# Patient Record
Sex: Male | Born: 1990 | Race: Black or African American | Hispanic: No | Marital: Married | Smoking: Current some day smoker
Health system: Southern US, Community
[De-identification: ages and names within clinical notes are randomized; demographics above are authoritative.]

## PROBLEM LIST (undated history)

## (undated) DIAGNOSIS — Z789 Other specified health status: Secondary | ICD-10-CM

## (undated) HISTORY — PX: NO PAST SURGERIES: SHX2092

---

## 2013-09-14 ENCOUNTER — Emergency Department (HOSPITAL_COMMUNITY)
Admission: EM | Admit: 2013-09-14 | Discharge: 2013-09-14 | Disposition: A | Discharge status: Home / Self Care | Payer: BC Managed Care – PPO | Attending: Emergency Medicine | Admitting: Emergency Medicine

## 2013-09-14 ENCOUNTER — Encounter (HOSPITAL_COMMUNITY): Payer: Self-pay | Admitting: Emergency Medicine

## 2013-09-14 DIAGNOSIS — Y9289 Other specified places as the place of occurrence of the external cause: Secondary | ICD-10-CM | POA: Diagnosis not present

## 2013-09-14 DIAGNOSIS — X500XXA Overexertion from strenuous movement or load, initial encounter: Secondary | ICD-10-CM | POA: Insufficient documentation

## 2013-09-14 DIAGNOSIS — M25569 Pain in unspecified knee: Secondary | ICD-10-CM | POA: Diagnosis not present

## 2013-09-14 DIAGNOSIS — IMO0002 Reserved for concepts with insufficient information to code with codable children: Secondary | ICD-10-CM | POA: Insufficient documentation

## 2013-09-14 DIAGNOSIS — Y9389 Activity, other specified: Secondary | ICD-10-CM | POA: Diagnosis not present

## 2013-09-14 DIAGNOSIS — Z880 Allergy status to penicillin: Secondary | ICD-10-CM | POA: Diagnosis not present

## 2013-09-14 DIAGNOSIS — M25561 Pain in right knee: Secondary | ICD-10-CM

## 2013-09-14 DIAGNOSIS — S29012A Strain of muscle and tendon of back wall of thorax, initial encounter: Secondary | ICD-10-CM

## 2013-09-14 DIAGNOSIS — Y990 Civilian activity done for income or pay: Secondary | ICD-10-CM | POA: Diagnosis not present

## 2013-09-14 DIAGNOSIS — F17200 Nicotine dependence, unspecified, uncomplicated: Secondary | ICD-10-CM | POA: Insufficient documentation

## 2013-09-14 DIAGNOSIS — M25562 Pain in left knee: Secondary | ICD-10-CM

## 2013-09-14 MED ORDER — CYCLOBENZAPRINE HCL 10 MG PO TABS: 10.0000 mg | ORAL_TABLET | Freq: Every day | ORAL | Status: DC

## 2013-09-14 MED ORDER — IBUPROFEN 800 MG PO TABS: 800.0000 mg | ORAL_TABLET | Freq: Three times a day (TID) | ORAL | Status: DC

## 2013-09-14 NOTE — ED Provider Notes (Signed)
CSN: [id number]     Arrival date & time 09/14/13  1745 History  This chart was scribed for Marc DrownLauren Elmor Kost, PA-C working with Marc DrownLauren Marc Ironside, PA-C working with Marc CiscoMegan E Docherty, MD by Evon Slackerrance Branch, ED Scribe. This patient was seen in room TR06C/TR06C and the patient's care was started at 5:58 PM.    Chief Complaint  Patient presents with  . Back Pain   HPI Comments:  Marc Adkins is a 23 y.o. male who presents to the Emergency Department complaining of sharp right sided back pain onset yesterday. He states he was lifting something heavy at work he states he felt something pull in his upper right back. He states his pain severity is 8/10. He states he has difficulty sleeping due to the pain in his back. He states he took tylenol today with no relief to his symptoms. He denies any previous back injuries. He denies any other related symptoms. He also states that he has been having joint pains down in his knees for several months, no recent injury. He states he doesn't have a PCP.   Patient is a 23 y.o. male presenting with back pain. The history is provided by the patient. No language interpreter was used.  Back Pain Associated symptoms: no chest pain     History reviewed. No pertinent past medical history. History reviewed. No pertinent past surgical history. History reviewed. No pertinent family history. History  Substance Use Topics  . Smoking status: Current Some Day Smoker    Types: Cigarettes  . Smokeless tobacco: Never Used  . Alcohol Use: No    Review of Systems  Respiratory: Negative for cough and shortness of breath.   Cardiovascular: Negative for chest pain.  Musculoskeletal: Positive for arthralgias and myalgias. Negative for back pain.  Skin: Negative for color change and wound.  All other systems reviewed and are negative.   Allergies  Penicillins  Home Medications   Prior to Admission medications   Not on File   There were no vitals taken for this  visit. Physical Exam  Nursing note and vitals reviewed. Constitutional: He is oriented to person, place, and time. He appears well-developed and well-nourished.  Non-toxic appearance. He does not have a sickly appearance. He does not appear ill. No distress.  HENT:  Head: Normocephalic and atraumatic.  Eyes: Conjunctivae and EOM are normal.  Neck: Normal range of motion. Neck supple.  Pulmonary/Chest: Effort normal. No respiratory distress.  Musculoskeletal: Normal range of motion. He exhibits tenderness.       Right knee: He exhibits normal range of motion, no effusion and no deformity.       Left knee: He exhibits normal range of motion, no effusion and no deformity.       Thoracic back: He exhibits tenderness. He exhibits normal range of motion and no swelling.       Back:  No midline T-spine, or L-spine tenderness with no step-offs, crepitus, or deformities noted. Tenderness to right latissimus no obvious spasm, deformity. Knee: mild apophysitis of the tibial tubercle  Neurological: He is alert and oriented to person, place, and time.  Skin: Skin is warm and dry. He is not diaphoretic. No erythema.  Psychiatric: He has a normal mood and affect. His behavior is normal.    ED Course  Procedures (including critical care time)   Labs Review Labs Reviewed - No data to display  Imaging Review No results found.   EKG Interpretation None      MDM  Final diagnoses:  Strain of latissimus dorsi muscle  Knee pain, bilateral   Patient presents with right latissimus dorsi discomfort, worse with movement. No obvious spasm will treat with ibuprofen and Flexeril were sleeping. Ice encouraged. Patient also reports bilateral knee discomfort on going for a long time. No recent injury. 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  Ortho follow up given. Discussed treatment plan with the patient. Return precautions given. Reports understanding and no other concerns at this time.  Patient is  stable for discharge at this time. Meds given in ED:  Medications - No data to display  New Prescriptions   CYCLOBENZAPRINE (FLEXERIL) 10 MG TABLET    Take 1 tablet (10 mg total) by mouth at bedtime.   IBUPROFEN (ADVIL,MOTRIN) 800 MG TABLET    Take 1 tablet (800 mg total) by mouth 3 (three) times daily. Take with food    \ I personally performed the services described in this documentation, which was scribed in my presence. The recorded information has been reviewed and is accurate.      [name], PA-C 09/14/13 [phone]

## 2013-09-14 NOTE — ED Notes (Signed)
Pt reports he lifted something heavy yesterday at work and the rt side of his back hurts now. Pain is a 8/10 pain scale.

## 2013-09-14 NOTE — Discharge Instructions (Signed)
Call for a follow up appointment with a Family or Primary Care Provider.  Return if Symptoms worsen.   Take medication as prescribed.  Do not operate heavy machinery or make important decisions while taking flexeril. Ice your back and knees 3-4 times a day.

## 2013-09-14 NOTE — ED Notes (Signed)
Declined W/C at D/C and was escorted to lobby by RN. 

## 2013-09-16 NOTE — ED Provider Notes (Signed)
Medical screening examination/treatment/procedure(s) were performed by non-physician practitioner and as supervising physician I was immediately available for consultation/collaboration.   [name], MD 09/16/13 [phone]

## 2013-11-25 ENCOUNTER — Emergency Department (HOSPITAL_COMMUNITY): Payer: BC Managed Care – PPO

## 2013-11-25 ENCOUNTER — Encounter (HOSPITAL_COMMUNITY): Payer: Self-pay | Admitting: Emergency Medicine

## 2013-11-25 ENCOUNTER — Emergency Department (HOSPITAL_COMMUNITY)
Admission: EM | Admit: 2013-11-25 | Discharge: 2013-11-25 | Disposition: A | Discharge status: Home / Self Care | Payer: BC Managed Care – PPO | Attending: Emergency Medicine | Admitting: Emergency Medicine

## 2013-11-25 DIAGNOSIS — F17200 Nicotine dependence, unspecified, uncomplicated: Secondary | ICD-10-CM | POA: Diagnosis not present

## 2013-11-25 DIAGNOSIS — Y9239 Other specified sports and athletic area as the place of occurrence of the external cause: Secondary | ICD-10-CM | POA: Diagnosis not present

## 2013-11-25 DIAGNOSIS — Y92838 Other recreation area as the place of occurrence of the external cause: Secondary | ICD-10-CM

## 2013-11-25 DIAGNOSIS — S62308A Unspecified fracture of other metacarpal bone, initial encounter for closed fracture: Secondary | ICD-10-CM

## 2013-11-25 DIAGNOSIS — W2209XA Striking against other stationary object, initial encounter: Secondary | ICD-10-CM | POA: Diagnosis not present

## 2013-11-25 DIAGNOSIS — S0120XA Unspecified open wound of nose, initial encounter: Secondary | ICD-10-CM | POA: Diagnosis not present

## 2013-11-25 DIAGNOSIS — Y9361 Activity, american tackle football: Secondary | ICD-10-CM | POA: Diagnosis not present

## 2013-11-25 DIAGNOSIS — S62319A Displaced fracture of base of unspecified metacarpal bone, initial encounter for closed fracture: Secondary | ICD-10-CM | POA: Diagnosis not present

## 2013-11-25 DIAGNOSIS — W219XXA Striking against or struck by unspecified sports equipment, initial encounter: Secondary | ICD-10-CM | POA: Diagnosis not present

## 2013-11-25 DIAGNOSIS — S6990XA Unspecified injury of unspecified wrist, hand and finger(s), initial encounter: Secondary | ICD-10-CM | POA: Insufficient documentation

## 2013-11-25 DIAGNOSIS — Z880 Allergy status to penicillin: Secondary | ICD-10-CM | POA: Insufficient documentation

## 2013-11-25 MED ORDER — HYDROCODONE-ACETAMINOPHEN 5-325 MG PO TABS: 1.0000 | ORAL_TABLET | Freq: Four times a day (QID) | ORAL | Status: DC | PRN

## 2013-11-25 NOTE — ED Notes (Signed)
Pt states he was playing football 2 wks ago when he got elbowed in the face.  They lost the game so he hit a wall.  C/o nose pain and rt hand pain.

## 2013-11-25 NOTE — ED Notes (Signed)
Ortho still in room putting on splint.

## 2013-11-25 NOTE — Discharge Instructions (Signed)
Return here as needed.  Go to Dr. Carlos Levering office tomorrow morning at 7:30 AM for an appointment

## 2013-11-25 NOTE — ED Notes (Signed)
Ortho called for splint  

## 2013-11-25 NOTE — ED Provider Notes (Signed)
CSN: [id number]     Arrival date & time 11/25/13  1324 History   First MD Initiated Contact with Patient 11/25/13 1006     Chief Complaint  Patient presents with  . Facial Pain  . Hand Injury     (Consider location/radiation/quality/duration/timing/severity/associated sxs/prior Treatment) HPI Patient presents to the emergency department with right hand injury that occurred 2 weeks ago.  Patient, states, that he punched a wall after losing a football game.  The patient, states, that he has pain with palpation and movement.  Patient, states, that he thought the area would get better on its own but did not improved.  Patient denies numbness or weakness in the hand.  Patient, states, that he did get an elbow to the nose during this same game and has pain to that area that has improved over the last 2 weeks.         History reviewed. No pertinent past medical history. History reviewed. No pertinent past surgical history. History reviewed. No pertinent family history. History  Substance Use Topics  . Smoking status: Current Some Day Smoker    Types: Cigarettes  . Smokeless tobacco: Never Used  . Alcohol Use: No    Review of Systems  All other systems negative except as documented in the HPI. All pertinent positives and negatives as reviewed in the HPI.  Allergies  Penicillins  Home Medications   Prior to Admission medications   Medication Sig Start Date End Date Taking? Authorizing Provider  acetaminophen (TYLENOL) 500 MG tablet Take 500 mg by mouth every 4 (four) hours as needed for moderate pain or headache.   Yes Historical Provider, MD  Ibuprofen-Diphenhydramine Cit (ADVIL PM PO) Take 1-2 tablets by mouth once as needed (pain.).   Yes Historical Provider, MD   BP 121/58  Pulse 57  Temp(Src) 98 F (36.7 C) (Oral)  Resp 18  SpO2 100% Physical Exam  Nursing note and vitals reviewed. Constitutional: He appears well-developed and well-nourished.  HENT:  Head:  Normocephalic.  Nose:    Pulmonary/Chest: Effort normal.  Musculoskeletal:       Right hand: He exhibits decreased range of motion, tenderness, bony tenderness, deformity and swelling. He exhibits normal two-point discrimination, normal capillary refill and no laceration. Normal sensation noted. Normal strength noted.    ED Course  Procedures (including critical care time) Labs Review Labs Reviewed - No data to display  Imaging Review Dg Hand Complete Right  11/25/2013   CLINICAL DATA:  Pain post trauma  EXAM: RIGHT HAND - COMPLETE 3+ VIEW  COMPARISON:  None.  FINDINGS: Frontal, oblique, and lateral views were obtained. There is a comminuted fracture through the midportion of the fourth metacarpal with volar angulation distally. There is also slight medial displacement distally. No other fractures. No dislocation. Joint spaces appear intact.  IMPRESSION: Comminuted fracture mid portion fourth metacarpal with angulation and displacement.   Electronically Signed   By: Bretta Bang M.D.   On: 11/25/2013 10:08      I spoke with Dr. Amanda Pea of hand surgery, who will see the patient in his office tomorrow morning.  At 7:30 AM he did review the x-rays.  Patient is given a plan and all questions were answered.  The patient was an understanding and I made it clear to him, that he'll need surgery to fix this area for Full recovery  Carlyle Dolly, PA-C 11/25/13 1147

## 2013-11-28 NOTE — ED Provider Notes (Signed)
Medical screening examination/treatment/procedure(s) were performed by non-physician practitioner and as supervising physician I was immediately available for consultation/collaboration.   Toy Baker, MD 11/28/13 [phone]

## 2013-12-01 ENCOUNTER — Encounter (HOSPITAL_COMMUNITY): Payer: Self-pay | Admitting: *Deleted

## 2013-12-01 ENCOUNTER — Encounter (HOSPITAL_COMMUNITY): Payer: Self-pay | Admitting: Pharmacy Technician

## 2013-12-02 ENCOUNTER — Ambulatory Visit (HOSPITAL_COMMUNITY)
Admission: RE | Admit: 2013-12-02 | Discharge: 2013-12-02 | Disposition: A | Discharge status: Home / Self Care | Payer: BC Managed Care – PPO | Source: Ambulatory Visit | Attending: Orthopedic Surgery | Admitting: Orthopedic Surgery

## 2013-12-02 ENCOUNTER — Encounter (HOSPITAL_COMMUNITY): Payer: Self-pay | Admitting: *Deleted

## 2013-12-02 ENCOUNTER — Ambulatory Visit (HOSPITAL_COMMUNITY): Payer: BC Managed Care – PPO | Admitting: Certified Registered Nurse Anesthetist

## 2013-12-02 ENCOUNTER — Encounter (HOSPITAL_COMMUNITY): Payer: BC Managed Care – PPO | Admitting: Certified Registered Nurse Anesthetist

## 2013-12-02 ENCOUNTER — Encounter (HOSPITAL_COMMUNITY)
Admission: RE | Disposition: A | Discharge status: Home / Self Care | Payer: Self-pay | Source: Ambulatory Visit | Attending: Orthopedic Surgery

## 2013-12-02 DIAGNOSIS — F17200 Nicotine dependence, unspecified, uncomplicated: Secondary | ICD-10-CM | POA: Insufficient documentation

## 2013-12-02 DIAGNOSIS — S62309A Unspecified fracture of unspecified metacarpal bone, initial encounter for closed fracture: Secondary | ICD-10-CM | POA: Diagnosis present

## 2013-12-02 DIAGNOSIS — X58XXXA Exposure to other specified factors, initial encounter: Secondary | ICD-10-CM | POA: Diagnosis not present

## 2013-12-02 DIAGNOSIS — Z880 Allergy status to penicillin: Secondary | ICD-10-CM | POA: Insufficient documentation

## 2013-12-02 HISTORY — PX: OPEN REDUCTION INTERNAL FIXATION (ORIF) METACARPAL: SHX6234

## 2013-12-02 HISTORY — DX: Other specified health status: Z78.9

## 2013-12-02 LAB — CBC
HEMATOCRIT: 37.7 % — AB (ref 39.0–52.0)
Hemoglobin: 12.6 g/dL — ABNORMAL LOW (ref 13.0–17.0)
MCH: 31 pg (ref 26.0–34.0)
MCHC: 33.4 g/dL (ref 30.0–36.0)
MCV: 92.6 fL (ref 78.0–100.0)
Platelets: 212 10*3/uL (ref 150–400)
RBC: 4.07 MIL/uL — ABNORMAL LOW (ref 4.22–5.81)
RDW: 11.9 % (ref 11.5–15.5)
WBC: 4.7 10*3/uL (ref 4.0–10.5)

## 2013-12-02 SURGERY — OPEN REDUCTION INTERNAL FIXATION (ORIF) METACARPAL
Anesthesia: General | Laterality: Right

## 2013-12-02 MED ORDER — BUPIVACAINE HCL (PF) 0.25 % IJ SOLN
INTRAMUSCULAR | Status: DC | PRN
  Administered 2013-12-02: 10 mL

## 2013-12-02 MED ORDER — MIDAZOLAM HCL 2 MG/2ML IJ SOLN
INTRAMUSCULAR | Status: AC
  Filled 2013-12-02: qty 2

## 2013-12-02 MED ORDER — ONDANSETRON HCL 4 MG/2ML IJ SOLN
INTRAMUSCULAR | Status: DC | PRN
  Administered 2013-12-02: 4 mg via INTRAVENOUS

## 2013-12-02 MED ORDER — ACETAMINOPHEN 160 MG/5ML PO SOLN: 325.0000 mg | ORAL | Status: DC | PRN

## 2013-12-02 MED ORDER — SULFAMETHOXAZOLE-TMP DS 800-160 MG PO TABS: 1.0000 | ORAL_TABLET | Freq: Two times a day (BID) | ORAL | Status: AC

## 2013-12-02 MED ORDER — BUPIVACAINE HCL (PF) 0.25 % IJ SOLN
INTRAMUSCULAR | Status: AC
  Filled 2013-12-02: qty 30

## 2013-12-02 MED ORDER — FENTANYL CITRATE 0.05 MG/ML IJ SOLN
INTRAMUSCULAR | Status: AC
  Filled 2013-12-02: qty 2

## 2013-12-02 MED ORDER — 0.9 % SODIUM CHLORIDE (POUR BTL) OPTIME
TOPICAL | Status: DC | PRN
  Administered 2013-12-02: 1000 mL

## 2013-12-02 MED ORDER — OXYCODONE HCL 5 MG PO TABS
ORAL_TABLET | ORAL | Status: AC
  Filled 2013-12-02: qty 1

## 2013-12-02 MED ORDER — OXYCODONE HCL 5 MG/5ML PO SOLN: 5.0000 mg | Freq: Once | ORAL | Status: AC | PRN

## 2013-12-02 MED ORDER — PROPOFOL 10 MG/ML IV BOLUS
INTRAVENOUS | Status: AC
  Filled 2013-12-02: qty 20

## 2013-12-02 MED ORDER — VANCOMYCIN HCL IN DEXTROSE 1-5 GM/200ML-% IV SOLN
INTRAVENOUS | Status: AC
  Administered 2013-12-02: 1000 mg via INTRAVENOUS
  Filled 2013-12-02: qty 200

## 2013-12-02 MED ORDER — FENTANYL CITRATE 0.05 MG/ML IJ SOLN
INTRAMUSCULAR | Status: DC | PRN
  Administered 2013-12-02: 50 ug via INTRAVENOUS
  Administered 2013-12-02 (×2): 25 ug via INTRAVENOUS

## 2013-12-02 MED ORDER — OXYCODONE HCL 5 MG PO TABS
5.0000 mg | ORAL_TABLET | Freq: Once | ORAL | Status: AC | PRN
  Administered 2013-12-02: 5 mg via ORAL

## 2013-12-02 MED ORDER — LIDOCAINE HCL (CARDIAC) 20 MG/ML IV SOLN
INTRAVENOUS | Status: AC
  Filled 2013-12-02: qty 5

## 2013-12-02 MED ORDER — PROPOFOL 10 MG/ML IV BOLUS
INTRAVENOUS | Status: DC | PRN
  Administered 2013-12-02: 180 mg via INTRAVENOUS

## 2013-12-02 MED ORDER — MIDAZOLAM HCL 5 MG/5ML IJ SOLN
INTRAMUSCULAR | Status: DC | PRN
  Administered 2013-12-02: 2 mg via INTRAVENOUS

## 2013-12-02 MED ORDER — ACETAMINOPHEN 325 MG PO TABS: 325.0000 mg | ORAL_TABLET | ORAL | Status: DC | PRN

## 2013-12-02 MED ORDER — LACTATED RINGERS IV SOLN
INTRAVENOUS | Status: DC
  Administered 2013-12-02: 14:00:00 via INTRAVENOUS

## 2013-12-02 MED ORDER — LIDOCAINE HCL (CARDIAC) 20 MG/ML IV SOLN
INTRAVENOUS | Status: DC | PRN
  Administered 2013-12-02: 80 mg via INTRAVENOUS

## 2013-12-02 MED ORDER — ONDANSETRON HCL 4 MG/2ML IJ SOLN
INTRAMUSCULAR | Status: AC
  Filled 2013-12-02: qty 2

## 2013-12-02 MED ORDER — FENTANYL CITRATE 0.05 MG/ML IJ SOLN
INTRAMUSCULAR | Status: AC
  Filled 2013-12-02: qty 5

## 2013-12-02 MED ORDER — HYDROMORPHONE HCL PF 1 MG/ML IJ SOLN: 0.2500 mg | INTRAMUSCULAR | Status: DC | PRN

## 2013-12-02 MED ORDER — KETOROLAC TROMETHAMINE 30 MG/ML IJ SOLN: 15.0000 mg | Freq: Once | INTRAMUSCULAR | Status: DC | PRN

## 2013-12-02 MED ORDER — LACTATED RINGERS IV SOLN: INTRAVENOUS | Status: DC | PRN

## 2013-12-02 MED ORDER — OXYCODONE HCL 5 MG PO TABS: 5.0000 mg | ORAL_TABLET | ORAL | Status: DC | PRN

## 2013-12-02 MED ORDER — LACTATED RINGERS IV SOLN
INTRAVENOUS | Status: DC | PRN
  Administered 2013-12-02 (×2): via INTRAVENOUS

## 2013-12-02 SURGICAL SUPPLY — 64 items
BANDAGE ELASTIC 3 VELCRO ST LF (GAUZE/BANDAGES/DRESSINGS) ×3 IMPLANT
BANDAGE ELASTIC 4 VELCRO ST LF (GAUZE/BANDAGES/DRESSINGS) IMPLANT
BNDG COHESIVE 1X5 TAN STRL LF (GAUZE/BANDAGES/DRESSINGS) IMPLANT
BNDG CONFORM 2 STRL LF (GAUZE/BANDAGES/DRESSINGS) ×3 IMPLANT
BNDG ELASTIC 2 VLCR STRL LF (GAUZE/BANDAGES/DRESSINGS) ×3 IMPLANT
BNDG GAUZE ELAST 4 BULKY (GAUZE/BANDAGES/DRESSINGS) ×6 IMPLANT
CAP PIN ORTHO PINK (CAP) IMPLANT
CAP PIN PROTECTOR ORTHO WHT (CAP) IMPLANT
CORDS BIPOLAR (ELECTRODE) ×3 IMPLANT
COVER SURGICAL LIGHT HANDLE (MISCELLANEOUS) ×3 IMPLANT
CUFF TOURNIQUET SINGLE 18IN (TOURNIQUET CUFF) ×3 IMPLANT
CUFF TOURNIQUET SINGLE 24IN (TOURNIQUET CUFF) IMPLANT
DRAPE OEC MINIVIEW 54X84 (DRAPES) IMPLANT
DRAPE SURG 17X23 STRL (DRAPES) ×3 IMPLANT
DRSG EMULSION OIL 3X3 NADH (GAUZE/BANDAGES/DRESSINGS) ×3 IMPLANT
GAUZE SPONGE 2X2 8PLY STRL LF (GAUZE/BANDAGES/DRESSINGS) IMPLANT
GAUZE SPONGE 4X4 12PLY STRL (GAUZE/BANDAGES/DRESSINGS) IMPLANT
GAUZE XEROFORM 1X8 LF (GAUZE/BANDAGES/DRESSINGS) ×3 IMPLANT
GLOVE BIOGEL M STRL SZ7.5 (GLOVE) ×3 IMPLANT
GLOVE BIOGEL PI IND STRL 7.0 (GLOVE) ×1 IMPLANT
GLOVE BIOGEL PI INDICATOR 7.0 (GLOVE) ×2
GLOVE SS BIOGEL STRL SZ 8 (GLOVE) ×1 IMPLANT
GLOVE SUPERSENSE BIOGEL SZ 8 (GLOVE) ×2
GLOVE SURG SS PI 7.0 STRL IVOR (GLOVE) ×3 IMPLANT
GOWN STRL REUS W/ TWL LRG LVL3 (GOWN DISPOSABLE) ×2 IMPLANT
GOWN STRL REUS W/ TWL XL LVL3 (GOWN DISPOSABLE) ×3 IMPLANT
GOWN STRL REUS W/TWL LRG LVL3 (GOWN DISPOSABLE) ×4
GOWN STRL REUS W/TWL XL LVL3 (GOWN DISPOSABLE) ×6
K-WIRE SMTH SNGL TROCAR .028X4 (WIRE)
K-WIRE SMTH SNGL TROCAR .035X9 ×3 IMPLANT
KIT BASIN OR (CUSTOM PROCEDURE TRAY) ×3 IMPLANT
KIT ROOM TURNOVER OR (KITS) ×3 IMPLANT
KWIRE SMTH SNGL TROCAR .028X4 (WIRE) IMPLANT
KWIRE SMTH SNGL TROCAR .035X9 ×1 IMPLANT
MANIFOLD NEPTUNE II (INSTRUMENTS) ×3 IMPLANT
NEEDLE HYPO 25GX1X1/2 BEV (NEEDLE) IMPLANT
NS IRRIG 1000ML POUR BTL (IV SOLUTION) ×3 IMPLANT
PACK ORTHO EXTREMITY (CUSTOM PROCEDURE TRAY) ×3 IMPLANT
PAD ARMBOARD 7.5X6 YLW CONV (MISCELLANEOUS) ×6 IMPLANT
PAD CAST 4YDX4 CTTN HI CHSV (CAST SUPPLIES) IMPLANT
PADDING CAST COTTON 4X4 STRL (CAST SUPPLIES)
PADDING CAST SYNTHETIC 4 (CAST SUPPLIES) ×2
PADDING CAST SYNTHETIC 4X4 STR (CAST SUPPLIES) ×1 IMPLANT
PUTTY DBM STAGRAFT PLUS 2CC (Putty) ×3 IMPLANT
SOLUTION BETADINE 4OZ (MISCELLANEOUS) ×3 IMPLANT
SPLINT FIBERGLASS 4X30 (CAST SUPPLIES) ×3 IMPLANT
SPONGE GAUZE 2X2 STER 10/PKG (GAUZE/BANDAGES/DRESSINGS)
SPONGE GAUZE 4X4 12PLY STER LF (GAUZE/BANDAGES/DRESSINGS) ×3 IMPLANT
SPONGE SCRUB IODOPHOR (GAUZE/BANDAGES/DRESSINGS) IMPLANT
SUCTION FRAZIER TIP 10 FR DISP (SUCTIONS) ×3 IMPLANT
SUT MERSILENE 4 0 P 3 (SUTURE) IMPLANT
SUT PROLENE 4 0 PS 2 18 (SUTURE) IMPLANT
SUT PROLENE 5 0 PC 1 (SUTURE) ×3 IMPLANT
SUT VIC AB 2-0 CT1 27 (SUTURE)
SUT VIC AB 2-0 CT1 TAPERPNT 27 (SUTURE) IMPLANT
SUT VIC AB 4-0 P-3 18X BRD (SUTURE) ×1 IMPLANT
SUT VIC AB 4-0 P3 18 (SUTURE) ×2
SYR CONTROL 10ML LL (SYRINGE) IMPLANT
TOWEL OR 17X24 6PK STRL BLUE (TOWEL DISPOSABLE) ×3 IMPLANT
TOWEL OR 17X26 10 PK STRL BLUE (TOWEL DISPOSABLE) ×3 IMPLANT
TUBE CONNECTING 12'X1/4 (SUCTIONS) ×1
TUBE CONNECTING 12X1/4 (SUCTIONS) ×2 IMPLANT
UNDERPAD 30X30 INCONTINENT (UNDERPADS AND DIAPERS) ×3 IMPLANT
WATER STERILE IRR 1000ML POUR (IV SOLUTION) ×3 IMPLANT

## 2013-12-02 NOTE — Op Note (Signed)
See dictation#[id number] GramigMd

## 2013-12-02 NOTE — Anesthesia Preprocedure Evaluation (Addendum)
Anesthesia Evaluation  Patient identified by MRN, date of birth, ID band Patient awake    Reviewed: Allergy & Precautions, H&P , NPO status , Patient's Chart, lab work & pertinent test results  History of Anesthesia Complications Negative for: history of anesthetic complications  Airway Mallampati: I TM Distance: >3 FB Neck ROM: Full    Dental  (+) Teeth Intact, Dental Advisory Given   Pulmonary neg sleep apnea, neg COPDCurrent Smoker,  breath sounds clear to auscultation        Cardiovascular negative cardio ROS  Rhythm:Regular     Neuro/Psych negative neurological ROS  negative psych ROS   GI/Hepatic negative GI ROS, Neg liver ROS,   Endo/Other  negative endocrine ROS  Renal/GU negative Renal ROS     Musculoskeletal   Abdominal   Peds  Hematology   Anesthesia Other Findings   Reproductive/Obstetrics                        Anesthesia Physical Anesthesia Plan  ASA: I  Anesthesia Plan:    Post-op Pain Management:    Induction: Intravenous  Airway Management Planned: LMA  Additional Equipment:   Intra-op Plan:   Post-operative Plan: Extubation in OR  Informed Consent:   Dental advisory given  Plan Discussed with: CRNA and Surgeon  Anesthesia Plan Comments:       Anesthesia Quick Evaluation

## 2013-12-02 NOTE — H&P (Signed)
Marc Adkins is an 23 y.o. male.   Chief Complaint: Closed fourth metacarpal fracture displaced right upper extremity HPI: Patient presents for open reduction internal fixation right fourth metacarpal fracture  Patient denies other complaints.  This is a late presentation and is highly angulated.  He denies other complaints.  Patient presents for evaluation and treatment of the of their upper extremity predicament. The patient denies neck back chest or of abdominal pain. The patient notes that they have no lower extremity problems. The patient from primarily complains of the upper extremity pain noted.    Past Medical History  Diagnosis Date  . Medical history non-contributory     Past Surgical History  Procedure Laterality Date  . No past surgeries      History reviewed. No pertinent family history. Social History:  reports that he has been smoking Cigarettes.  He has a 2.64 pack-year smoking history. He has never used smokeless tobacco. He reports that he does not drink alcohol or use illicit drugs.  Allergies:  Allergies  Allergen Reactions  . Penicillins Anaphylaxis    Medications Prior to Admission  Medication Sig Dispense Refill  . HYDROcodone-acetaminophen (NORCO/VICODIN) 5-325 MG per tablet Take 1 tablet by mouth every 6 (six) hours as needed for moderate pain.  15 tablet  0  . Ibuprofen-Diphenhydramine Cit (ADVIL PM PO) Take 1-2 tablets by mouth once as needed (pain.).      Marland Kitchen oxyCODONE-acetaminophen (PERCOCET/ROXICET) 5-325 MG per tablet Take 2 tablets by mouth every 6 (six) hours as needed for severe pain.      Marland Kitchen ibuprofen (ADVIL,MOTRIN) 600 MG tablet Take 600 mg by mouth every 6 (six) hours as needed.        Results for orders placed during the hospital encounter of 12/02/13 (from the past 48 hour(s))  CBC     Status: Abnormal   Collection Time    12/02/13  1:54 PM      Result Value Ref Range   WBC 4.7  4.0 - 10.5 K/uL   RBC 4.07 (*) 4.22 - 5.81 MIL/uL   Hemoglobin 12.6 (*) 13.0 - 17.0 g/dL   HCT 40.9 (*) 81.1 - 91.4 %   MCV 92.6  78.0 - 100.0 fL   MCH 31.0  26.0 - 34.0 pg   MCHC 33.4  30.0 - 36.0 g/dL   RDW 78.2  95.6 - 21.3 %   Platelets 212  150 - 400 K/uL   No results found.  ROS  Blood pressure 118/59, pulse 58, temperature 97 F (36.1 C), temperature source Oral, resp. rate 20, height  (1.727 m), weight 71.986 kg (158 lb 11.2 oz), SpO2 100.00%. Physical Exam  Closed right fourth metacarpal fracture displaced we'll plan for reconstruction  he is neurovascularly intact  he has no signs of infection or compartment syndrome The patient is alert and oriented in no acute distress the patient complains of pain in the affected upper extremity.  The patient is noted to have a normal HEENT exam.  Lung fields show equal chest expansion and no shortness of breath  abdomen exam is nontender without distention.  Lower extremity examination does not show any fracture dislocation or blood clot symptoms.  Pelvis is stable neck and back are stable and nontender Assessment/Plan We'll plan for open reduction internal fixation right fourth metacarpal fracture as necessary.  In her stance risk and benefits as well as timeframe duration of recovery  We are planning surgery for your upper extremity. The risk and benefits  of surgery include risk of bleeding infection anesthesia damage to normal structures and failure of the surgery to accomplish its intended goals of relieving symptoms and restoring function with this in mind we'll going to proceed. I have specifically discussed with the patient the pre-and postoperative regime and the does and don'ts and risk and benefits in great detail. Risk and benefits of surgery also include risk of dystrophy chronic nerve pain failure of the healing process to go onto completion and other inherent risks of surgery The relavent the pathophysiology of the disease/injury process, as well as the alternatives for  treatment and postoperative course of action has been discussed in great detail with the patient who desires to proceed.  We will do everything in our power to help you (the patient) restore function to the upper extremity. Is a pleasure to see this patient today.   Karen Chafe 12/02/2013, 5:27 PM

## 2013-12-02 NOTE — Transfer of Care (Signed)
Immediate Anesthesia Transfer of Care Note  Patient: Marc Adkins  Procedure(s) Performed: Procedure(s): OPEN REDUCTION INTERNAL FIXATION (ORIF) RIGHT RING FINGER,FOURTH METACARPAL FRACTRURE (Right)  Patient Location: PACU  Anesthesia Type:General  Level of Consciousness: awake and alert   Airway & Oxygen Therapy: Patient Spontanous Breathing and Patient connected to nasal cannula oxygen  Post-op Assessment: Report given to PACU RN and Post -op Vital signs reviewed and stable  Post vital signs: Reviewed and stable  Complications: No apparent anesthesia complications

## 2013-12-02 NOTE — Anesthesia Procedure Notes (Signed)
Procedure Name: LMA Insertion Date/Time: 12/02/2013 6:01 PM Performed by: Sarita Haver T Pre-anesthesia Checklist: Patient identified, Emergency Drugs available, Suction available, Patient being monitored and Timeout performed Patient Re-evaluated:Patient Re-evaluated prior to inductionOxygen Delivery Method: Circle system utilized and Simple face mask Preoxygenation: Pre-oxygenation with 100% oxygen Intubation Type: IV induction Ventilation: Mask ventilation without difficulty LMA: LMA inserted LMA Size: 4.0 Number of attempts: 1 Airway Equipment and Method: Patient positioned with wedge pillow Placement Confirmation: positive ETCO2 and breath sounds checked- equal and bilateral Tube secured with: Tape Dental Injury: Teeth and Oropharynx as per pre-operative assessment

## 2013-12-02 NOTE — Discharge Instructions (Signed)

## 2013-12-03 NOTE — Anesthesia Postprocedure Evaluation (Signed)
Anesthesia Post Note  Patient: Marc Adkins  Procedure(s) Performed: Procedure(s) (LRB): OPEN REDUCTION INTERNAL FIXATION (ORIF) RIGHT RING FINGER,FOURTH METACARPAL FRACTRURE (Right)  Anesthesia type: general  Patient location: PACU  Post pain: Pain level controlled  Post assessment: Patient's Cardiovascular Status Stable  Last Vitals:  Filed Vitals:   12/02/13 2000  BP: 141/85  Pulse: 55  Temp: 36.7 C  Resp: 15    Post vital signs: Reviewed and stable  Level of consciousness: sedated  Complications: No apparent anesthesia complications

## 2013-12-03 NOTE — Op Note (Signed)
Marc Adkins, Marc Adkins             ACCOUNT NO.:  [id number]  MEDICAL RECORD NO.:  [id number]  LOCATION:  [facility]                         FACILITY:  [facility]  PHYSICIAN:  Dionne Ano. Atara Paterson, M.D.DATE OF BIRTH:  01-11-1991  DATE OF PROCEDURE: DATE OF DISCHARGE:  12/02/2013                              OPERATIVE REPORT   PREOPERATIVE DIAGNOSIS:  Displaced comminuted late presentation of a ring finger metacarpal fracture.  POSTOPERATIVE DIAGNOSIS:  Displaced comminuted late presentation of a ring finger metacarpal fracture.  PROCEDURE: 1. Open reduction and internal fixation, ring finger metacarpal     fracture with allograft bone graft and intramedullary wire     fixation. 2. AP and lateral oblique x-rays performed, examined, and interpreted     by myself. 3. Dorsal sensory branch ulnar nerve neurolysis, which was scarred to     the fracture region.  SURGEON:  Dionne Ano. Amanda Pea, M.D.  ASSISTANT:  Karie Chimera, P.A-C.  COMPLICATIONS:  None.  ANESTHESIA:  General.  INDICATIONS:  The patient is a pleasant male with a late presentation of ring finger metacarpal fracture.  I have recommended moving forward with ORIF.  He desires to proceed.  DESCRIPTION OF PROCEDURE:  The patient was seen by myself and Anesthesia, taken to operative suite, and underwent smooth induction of general anesthetic, laid supine, fully padded, prepped and draped in usual sterile fashion.  Betadine scrub and paint.  Once this was done, the patient then underwent a very careful and cautious draping followed by final time-out being called.  Pre and postop check was complete, and the patient then underwent a very careful and cautious elevation of tourniquet followed by a curvilinear incision over the fracture site. The fracture was exposed. I should note, the dissection was through skin with knife blade. Following this, blunt and sharp dissection was carried out with scissor tip, and a blunt instrument to  dissect down to the patient's fracture. Dorsal sensory branch to the ulnar nerve underwent distinct and separate neurolysis as it was adhered to the soft tissue to some degree.  Once this done, I then split the interval between the extensor apparatus. Identified the fracture site and then very carefully and cautiously dissected down.  The fracture unfortunately was horribly comminuted. There were bone chips in many areas.  I looked at the fracture very doubtfully and felt that an IM rod technique would be helpful for micromotion and to stimulate fracture callus.  This would also minimize exposed hardware.  At this time, we irrigated copiously and then made a counter incision over the 4th [facility] base.  Dissection was carried down.  Pilot hole was made and following this, a blunt-tipped prebent 0.062 K-wire was placed. An intramedullary technique to afford fixation of the fracture in reduced position.  I bone grafted this with allograft bone graft from the [employer] manufacture and also place bone bits from the comminuted fragments that we had in the vicinity.  The patient tolerated this well. Following this, I very carefully irrigated followed by closure of the periosteum with stitching and wanted to make sure we had a good periosteal closure.  I stress tested it nicely.  His splay looked excellent.  Wounds were closed with Prolene.  The patient was then taken to the recovery room after sterile dressing was applied.  We performed very meticulous sterile dressing without complicating features.  There were no complicating features.  I have discussed with he and his family all issues.  He will be monitored.  We will discharge him on Keflex as well as pain medicine.  We will see him back in 12-14 days cast at the time.  We are going to cast him for 4-6 weeks.  This was a highly unstable fracture at 6 weeks.  Consider moving forward with something removable pending x-ray evaluation.  It has  been a pleasure to see him today and participate in his postop recovery.  He tolerated the procedure quite well.  This was a very comminuted fracture.  Unfortunately, I do feel the late presentation made things a bit more challenging, however, I was very happy with the fixation and the overall alignment.  At the conclusion of the case, he had good rotation, good splay, and no complications.     Dionne Ano. Amanda Pea, M.D.     [facility]  D:  12/02/2013  T:  12/03/2013  Job:  [id number]

## 2013-12-04 ENCOUNTER — Encounter (HOSPITAL_COMMUNITY): Payer: Self-pay | Admitting: Orthopedic Surgery

## 2013-12-20 ENCOUNTER — Emergency Department (HOSPITAL_COMMUNITY)
Admission: EM | Admit: 2013-12-20 | Discharge: 2013-12-20 | Disposition: A | Discharge status: Home / Self Care | Payer: BC Managed Care – PPO | Attending: Emergency Medicine | Admitting: Emergency Medicine

## 2013-12-20 ENCOUNTER — Emergency Department (HOSPITAL_COMMUNITY): Payer: BC Managed Care – PPO

## 2013-12-20 ENCOUNTER — Encounter (HOSPITAL_COMMUNITY): Payer: Self-pay | Admitting: Emergency Medicine

## 2013-12-20 DIAGNOSIS — M79641 Pain in right hand: Secondary | ICD-10-CM

## 2013-12-20 DIAGNOSIS — Z23 Encounter for immunization: Secondary | ICD-10-CM | POA: Insufficient documentation

## 2013-12-20 DIAGNOSIS — Z880 Allergy status to penicillin: Secondary | ICD-10-CM | POA: Diagnosis not present

## 2013-12-20 DIAGNOSIS — M79609 Pain in unspecified limb: Secondary | ICD-10-CM | POA: Diagnosis not present

## 2013-12-20 DIAGNOSIS — G8911 Acute pain due to trauma: Secondary | ICD-10-CM | POA: Insufficient documentation

## 2013-12-20 DIAGNOSIS — F17200 Nicotine dependence, unspecified, uncomplicated: Secondary | ICD-10-CM | POA: Diagnosis not present

## 2013-12-20 MED ORDER — OXYCODONE HCL 5 MG PO TABS: 5.0000 mg | ORAL_TABLET | ORAL | Status: AC | PRN

## 2013-12-20 MED ORDER — TETANUS-DIPHTH-ACELL PERTUSSIS 5-2.5-18.5 LF-MCG/0.5 IM SUSP
0.5000 mL | Freq: Once | INTRAMUSCULAR | Status: AC
  Administered 2013-12-20: 0.5 mL via INTRAMUSCULAR
  Filled 2013-12-20: qty 0.5
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
## 2013-12-20 NOTE — Discharge Instructions (Signed)
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
## 2013-12-21 NOTE — ED Provider Notes (Signed)
I saw and evaluated the patient, reviewed the resident's note and I agree with the findings and plan.   .Face to face Exam:  General:  Awake HEENT:  Atraumatic Resp:  Normal effort Abd:  Nondistended Neuro:No focal weakness  Stacey Sago L Taytum Scheck, MD 12/21/13 0741 

## 2016-06-19 IMAGING — CR DG HAND COMPLETE 3+V*R*
3 series · 3 of 3 positions shown · non-contrast
Comparison: None.

CLINICAL DATA: Right hand injury with subsequent surgery on
December 02, 2013. Possible Re injury with pain in right ring finger
extending into the palm. The films are obtained in plaster.

EXAM:
RIGHT HAND - COMPLETE 3+ VIEW

[x hand pa right]
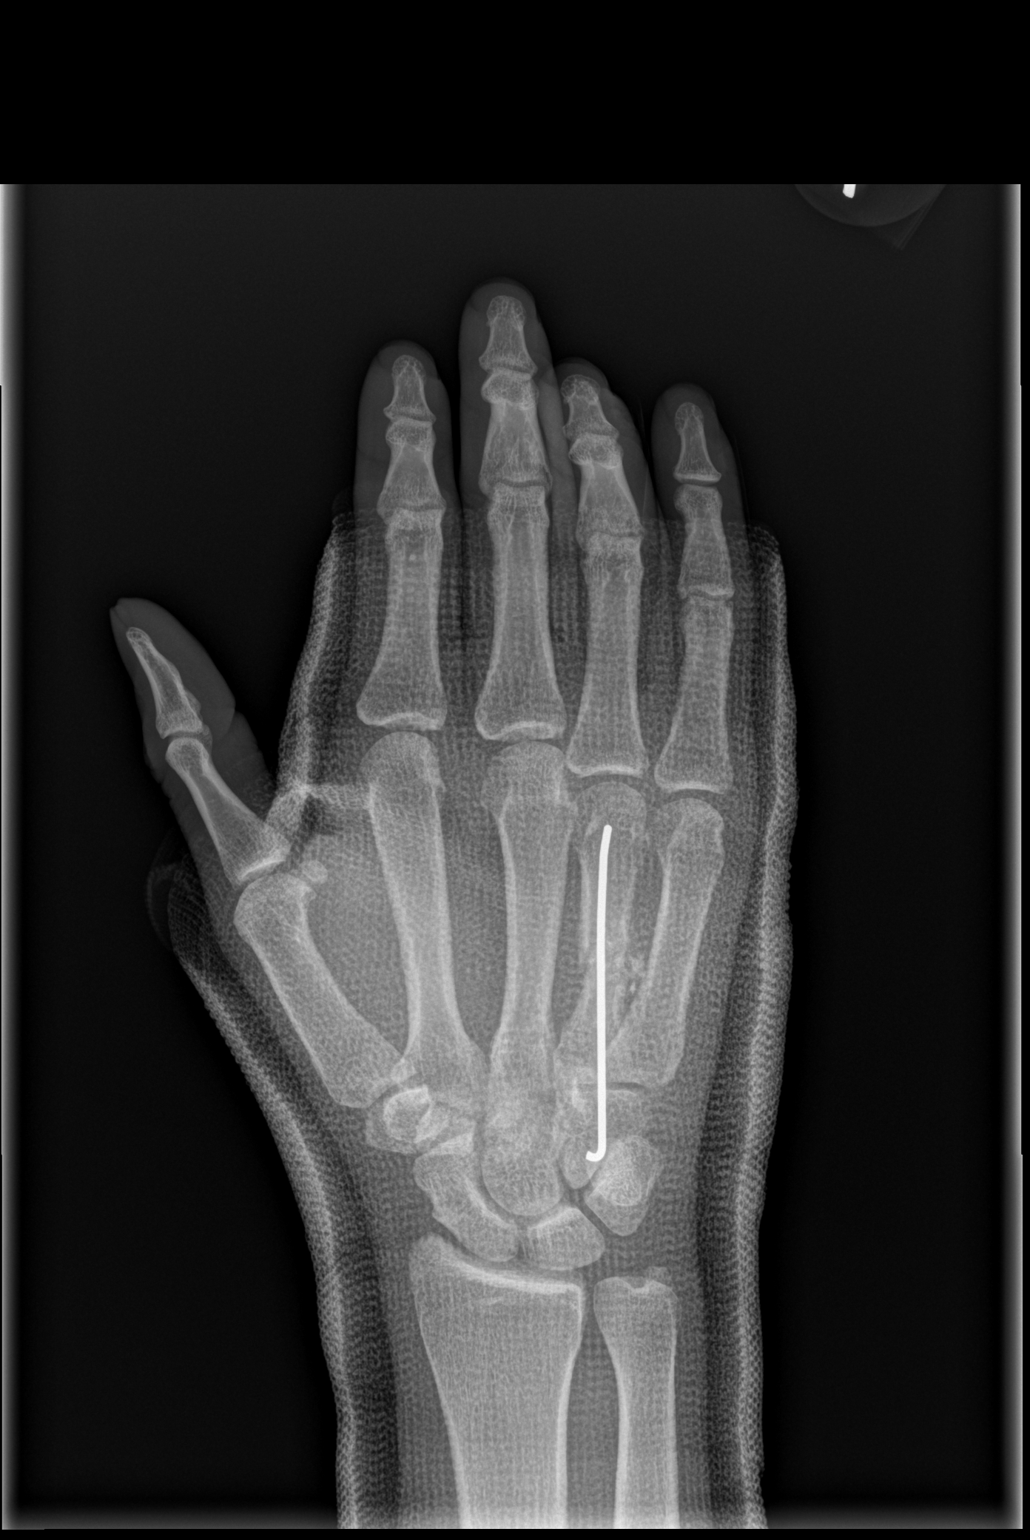

[x hand obl right]
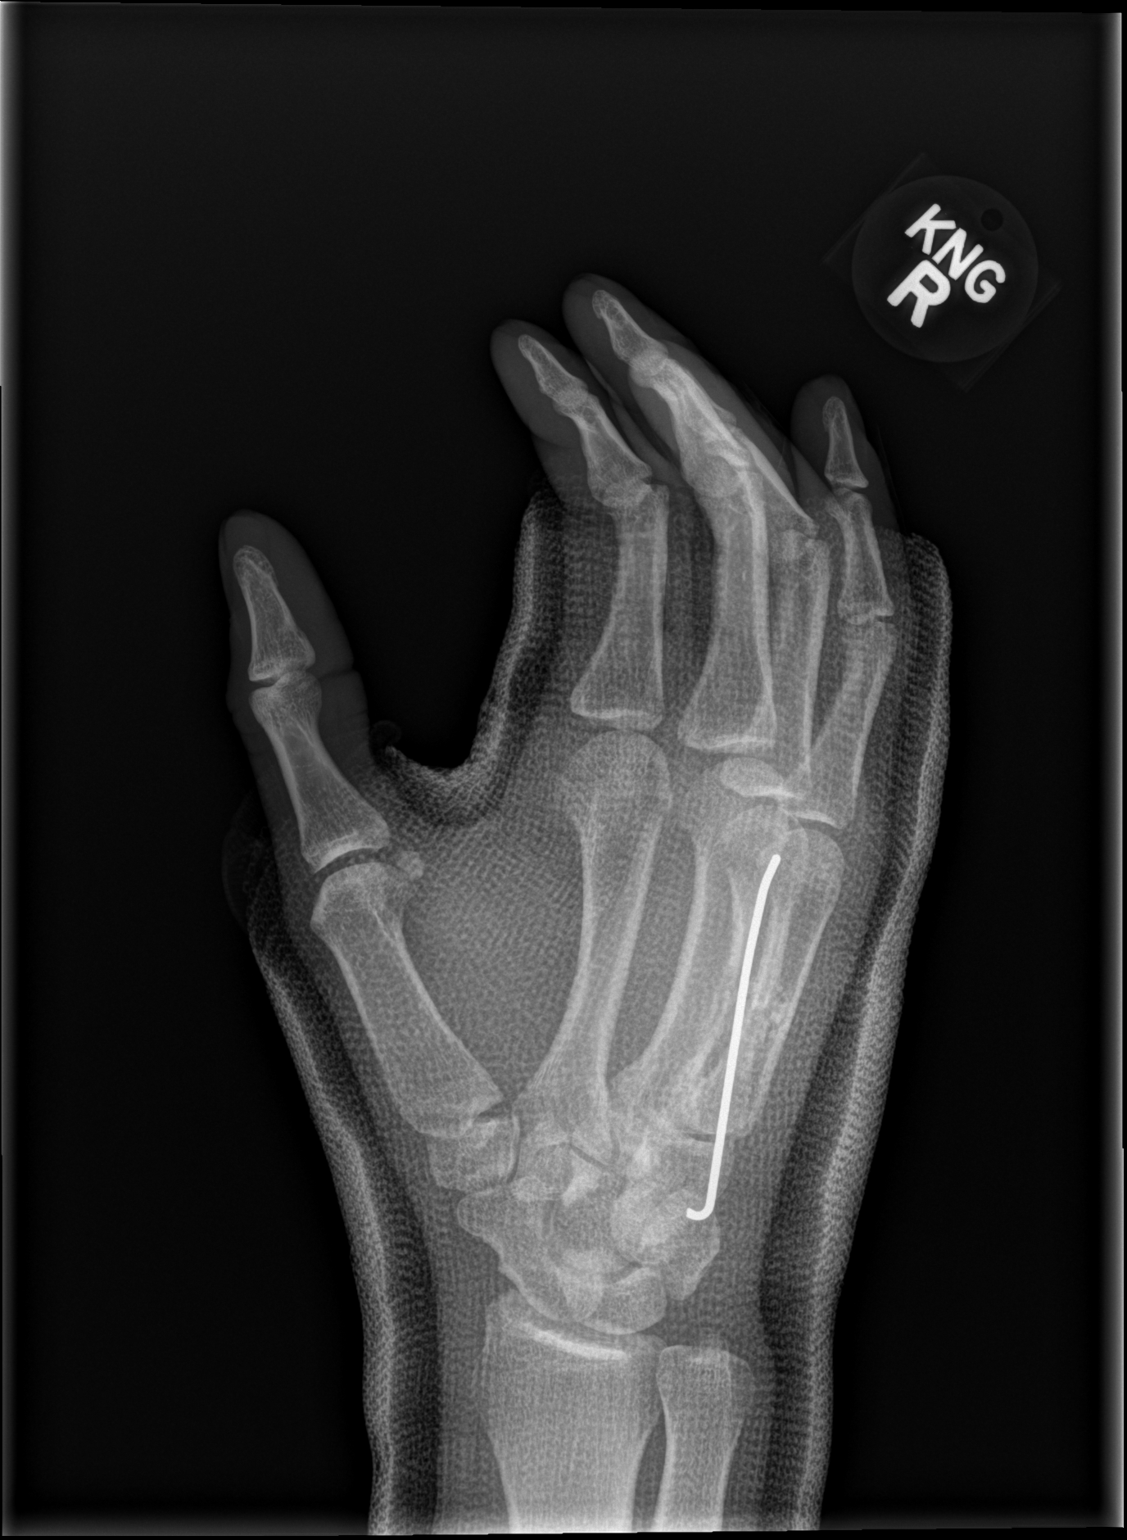

[x hand lat right]
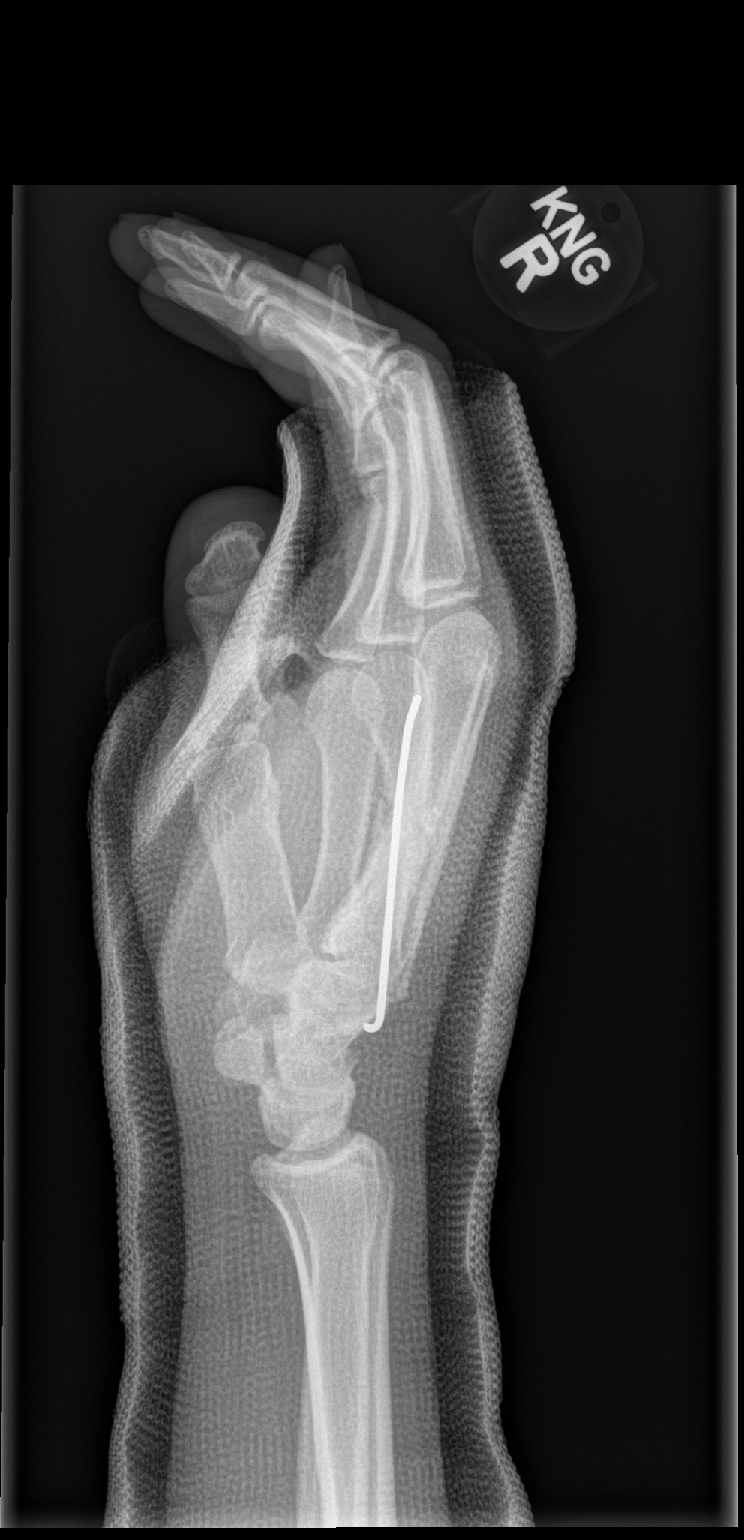

[3 of 3 positions shown; findings below may reference images not displayed]

FINDINGS: There is a metallic pin which traverses the shaft of the fourth
metacarpal longitudinally and extends into the hamate. Alignment of
the midshaft fracture is near anatomic. Bony fragmentation is still
visible. The other metacarpals are intact. The phalanges appear
intact.
IMPRESSION: There is no definite acute reinjury of the known fracture of the
midshaft of the fourth metacarpal.
